# Patient Record
Sex: Female | Born: 1950 | Race: White | Hispanic: No | Smoking: Former smoker
Health system: Southern US, Community
[De-identification: ages and names within clinical notes are randomized; demographics above are authoritative.]

## PROBLEM LIST (undated history)

## (undated) DIAGNOSIS — I10 Essential (primary) hypertension: Secondary | ICD-10-CM

## (undated) DIAGNOSIS — M8580 Other specified disorders of bone density and structure, unspecified site: Secondary | ICD-10-CM

## (undated) DIAGNOSIS — K219 Gastro-esophageal reflux disease without esophagitis: Secondary | ICD-10-CM

## (undated) DIAGNOSIS — R32 Unspecified urinary incontinence: Secondary | ICD-10-CM

## (undated) DIAGNOSIS — H269 Unspecified cataract: Secondary | ICD-10-CM

## (undated) DIAGNOSIS — G4730 Sleep apnea, unspecified: Secondary | ICD-10-CM

## (undated) DIAGNOSIS — G2581 Restless legs syndrome: Secondary | ICD-10-CM

## (undated) DIAGNOSIS — G709 Myoneural disorder, unspecified: Secondary | ICD-10-CM

## (undated) DIAGNOSIS — F419 Anxiety disorder, unspecified: Secondary | ICD-10-CM

## (undated) DIAGNOSIS — M1990 Unspecified osteoarthritis, unspecified site: Secondary | ICD-10-CM

## (undated) DIAGNOSIS — Z8619 Personal history of other infectious and parasitic diseases: Secondary | ICD-10-CM

## (undated) DIAGNOSIS — Z87440 Personal history of urinary (tract) infections: Secondary | ICD-10-CM

## (undated) DIAGNOSIS — T7840XA Allergy, unspecified, initial encounter: Secondary | ICD-10-CM

## (undated) HISTORY — DX: Unspecified osteoarthritis, unspecified site: M19.90

## (undated) HISTORY — DX: Personal history of other infectious and parasitic diseases: Z86.19

## (undated) HISTORY — PX: BACK SURGERY: SHX140

## (undated) HISTORY — PX: TONSILLECTOMY: SUR1361

## (undated) HISTORY — PX: REPLACEMENT TOTAL KNEE: SUR1224

## (undated) HISTORY — DX: Anxiety disorder, unspecified: F41.9

## (undated) HISTORY — DX: Other specified disorders of bone density and structure, unspecified site: M85.80

## (undated) HISTORY — DX: Myoneural disorder, unspecified: G70.9

## (undated) HISTORY — DX: Gastro-esophageal reflux disease without esophagitis: K21.9

## (undated) HISTORY — DX: Sleep apnea, unspecified: G47.30

## (undated) HISTORY — PX: OTHER SURGICAL HISTORY: SHX169

## (undated) HISTORY — DX: Allergy, unspecified, initial encounter: T78.40XA

## (undated) HISTORY — DX: Unspecified urinary incontinence: R32

## (undated) HISTORY — PX: TUBAL LIGATION: SHX77

## (undated) HISTORY — DX: Restless legs syndrome: G25.81

## (undated) HISTORY — DX: Unspecified cataract: H26.9

## (undated) HISTORY — PX: CARDIAC CATHETERIZATION: SHX172

## (undated) HISTORY — PX: HEMORROIDECTOMY: SUR656

## (undated) HISTORY — DX: Essential (primary) hypertension: I10

## (undated) HISTORY — DX: Personal history of urinary (tract) infections: Z87.440

## (undated) HISTORY — PX: CATARACT EXTRACTION, BILATERAL: SHX1313

## (undated) HISTORY — PX: CHOLECYSTECTOMY: SHX55

## (undated) HISTORY — PX: SALIVARY GLAND SURGERY: SHX768

---

## 2007-01-12 ENCOUNTER — Encounter: Admission: RE | Admit: 2007-01-12 | Discharge: 2007-01-12 | Payer: Self-pay | Admitting: Specialist

## 2008-05-22 ENCOUNTER — Encounter: Admission: RE | Admit: 2008-05-22 | Discharge: 2008-05-22 | Payer: Self-pay | Admitting: Specialist

## 2008-07-20 ENCOUNTER — Inpatient Hospital Stay (HOSPITAL_COMMUNITY): Admission: RE | Admit: 2008-07-20 | Discharge: 2008-07-23 | Payer: Self-pay | Admitting: Specialist

## 2008-07-20 IMAGING — CR DG CHEST 2V
2 series · 2 of 2 positions shown · non-contrast
Comparison: None available.

CLINICAL DATA: Preop respiratory film in patient for lumbar
surgery.

CHEST - 2 VIEW

[w chest pa]
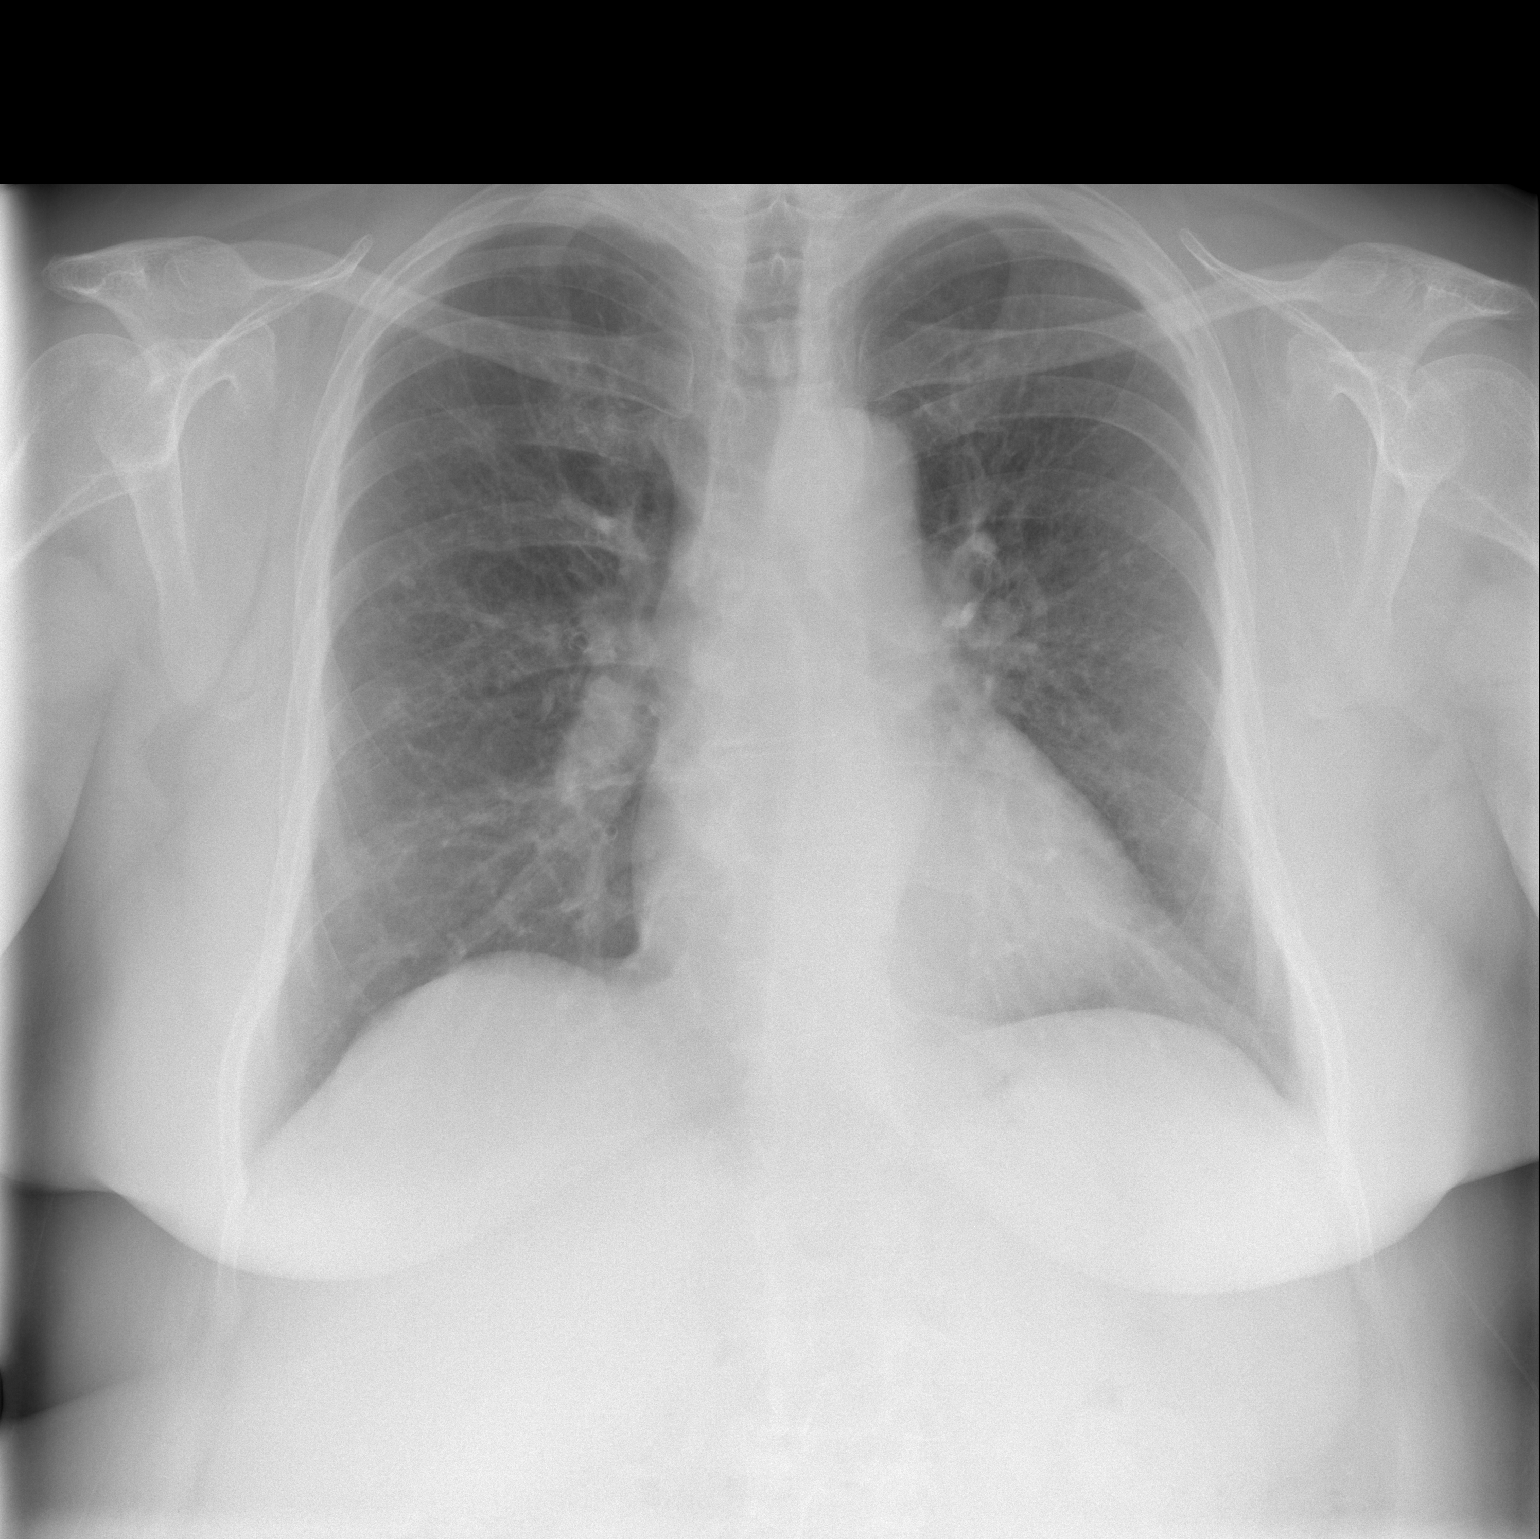

[w chest lat]
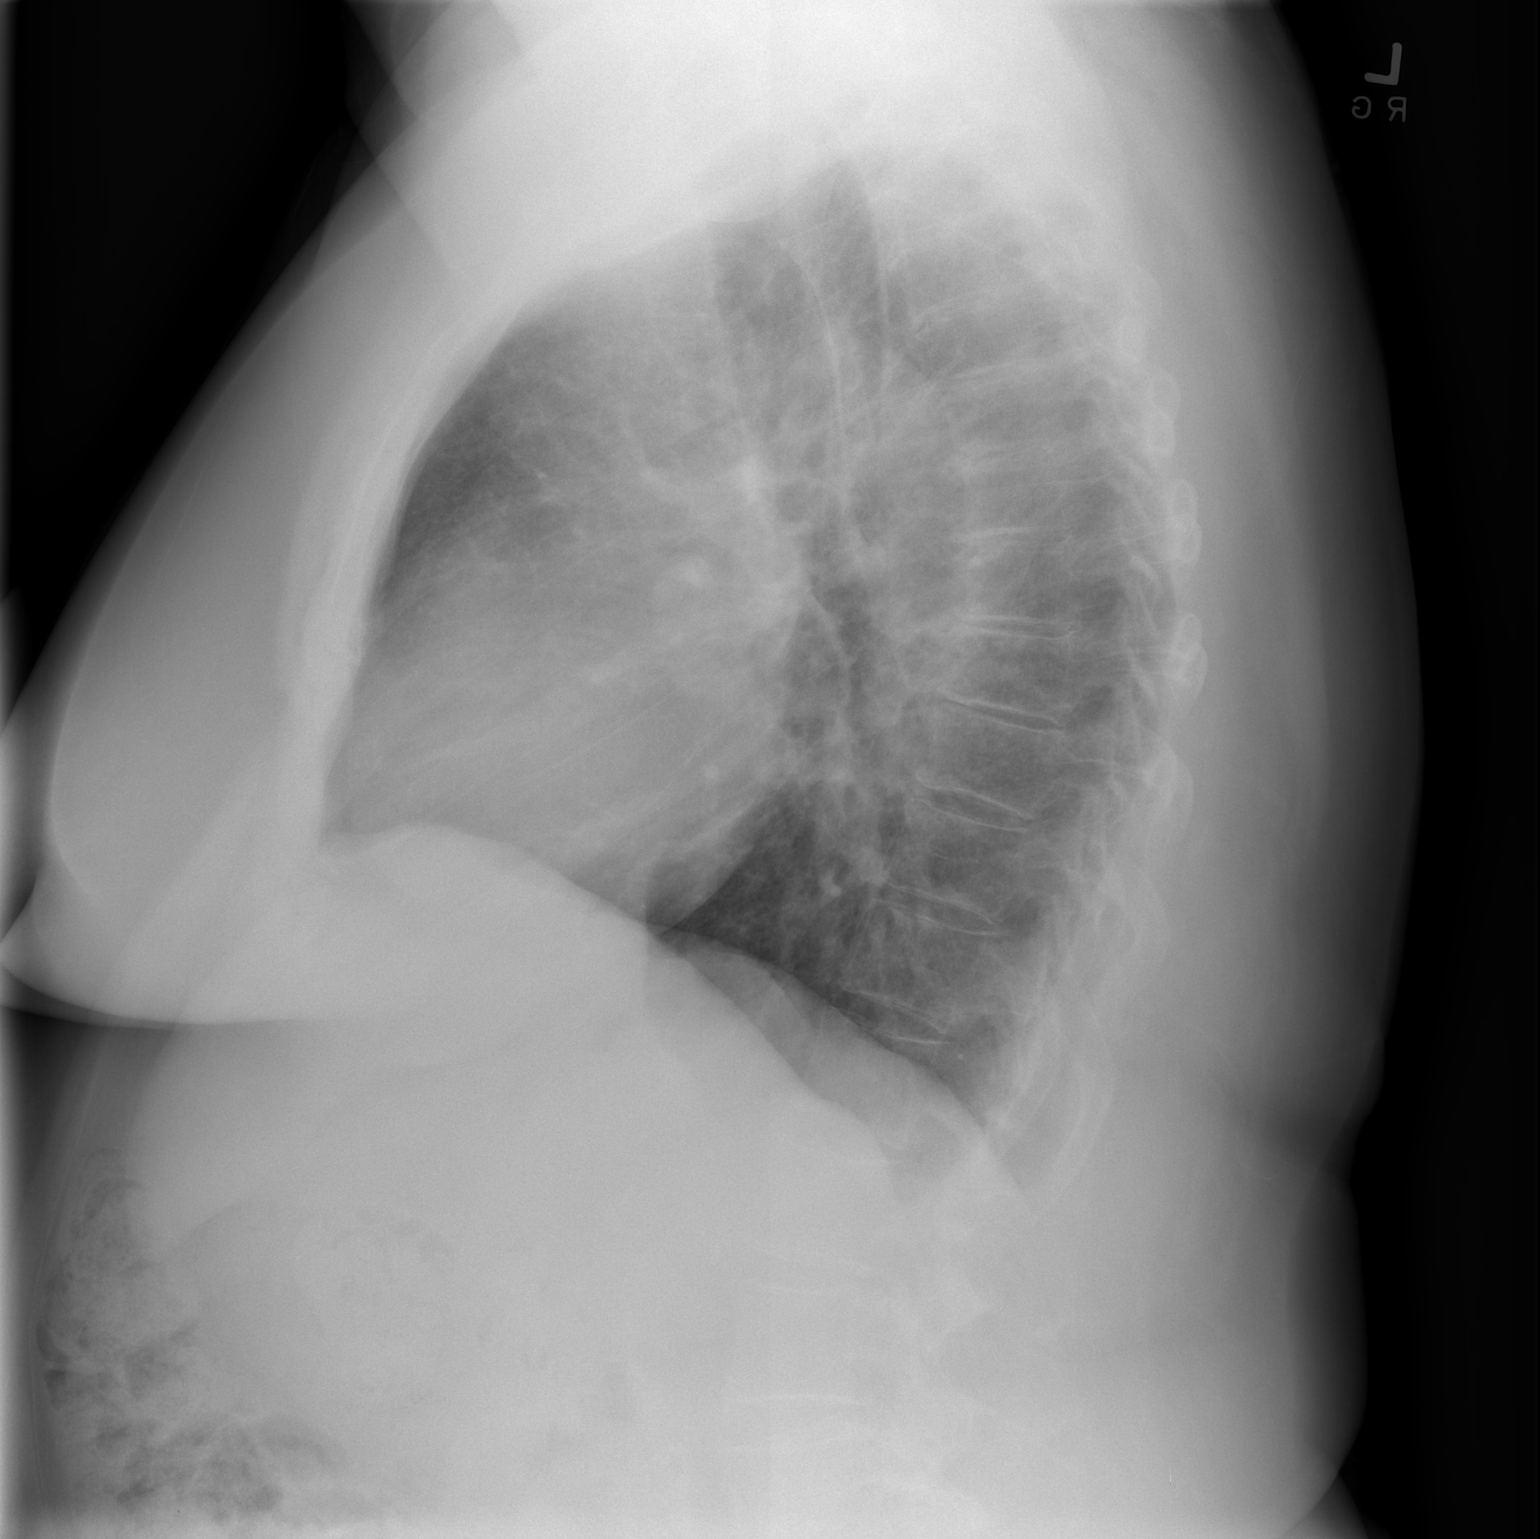

[2 of 2 positions shown; findings below may reference images not displayed]

FINDINGS: The lungs are clear.  Heart size is mildly enlarged.  No
pleural effusion or focal bony abnormality.
IMPRESSION: No acute disease.

## 2009-07-03 ENCOUNTER — Ambulatory Visit: Payer: Self-pay | Admitting: Gastroenterology

## 2009-07-03 DIAGNOSIS — I10 Essential (primary) hypertension: Secondary | ICD-10-CM

## 2009-07-03 DIAGNOSIS — K219 Gastro-esophageal reflux disease without esophagitis: Secondary | ICD-10-CM

## 2009-07-06 ENCOUNTER — Ambulatory Visit: Payer: Self-pay | Admitting: Gastroenterology

## 2009-07-06 ENCOUNTER — Encounter: Payer: Self-pay | Admitting: Gastroenterology

## 2009-07-10 ENCOUNTER — Encounter: Payer: Self-pay | Admitting: Gastroenterology

## 2009-07-25 ENCOUNTER — Ambulatory Visit: Payer: Self-pay | Admitting: Gastroenterology

## 2009-07-30 ENCOUNTER — Encounter: Payer: Self-pay | Admitting: Gastroenterology

## 2009-08-01 ENCOUNTER — Encounter: Payer: Self-pay | Admitting: Gastroenterology

## 2009-09-05 ENCOUNTER — Ambulatory Visit: Payer: Self-pay | Admitting: Gastroenterology

## 2009-09-05 DIAGNOSIS — R112 Nausea with vomiting, unspecified: Secondary | ICD-10-CM

## 2009-09-28 ENCOUNTER — Ambulatory Visit (HOSPITAL_COMMUNITY): Admission: RE | Admit: 2009-09-28 | Discharge: 2009-09-28 | Payer: Self-pay | Admitting: Gastroenterology

## 2009-09-28 ENCOUNTER — Telehealth: Payer: Self-pay | Admitting: Gastroenterology

## 2009-10-16 ENCOUNTER — Telehealth: Payer: Self-pay | Admitting: Gastroenterology

## 2009-10-22 ENCOUNTER — Ambulatory Visit: Payer: Self-pay | Admitting: Gastroenterology

## 2009-12-12 ENCOUNTER — Ambulatory Visit: Payer: Self-pay | Admitting: Gastroenterology

## 2010-10-09 NOTE — Progress Notes (Signed)
Summary: TRIAGE  Phone Note Call from Patient Call back at [phone]   Caller: Patient  [phone] Call For: Arlyce Dice Reason for Call: Talk to Nurse Summary of Call: Continues with heartburn Initial call taken by: Tawni Levy,  September 28, 2009 4:13 PM  Follow-up for Phone Call        She is taking Omperazole 40mg  at bedtime, her burning at night has improved, but still present. And, "I am burning all day long, I eat Tums like they are candy"  1) Increase Omperazole to two times a day  2) Soft,bland diet. No spicy,greasy,fried foods.  3) I will call pt., if new orders, after MD reviews.   Follow-up by: Laureen Ochs LPN,  September 28, 2009 4:21 PM  Additional Follow-up for Phone Call Additional follow up Details #1::        trial of reglan 10mg  1/2 ac and hs c/b 1 week Additional Follow-up by: Louis Meckel MD,  October 01, 2009 9:48 AM    Additional Follow-up for Phone Call Additional follow up Details #2::    Pt. states since taking Omeprazole 40mg  two times a day she has improvement in her symptoms, would like to try one week, before trying Reglan. She will continue Omeprazole two times a day and call back in one week with an update. We will hold Reglan for now. Follow-up by: Laureen Ochs LPN,  October 01, 2009 10:00 AM  New/Updated Medications: OMEPRAZOLE 40 MG  CPDR (OMEPRAZOLE) 1 each day 30 minutes before meal Prescriptions: OMEPRAZOLE 40 MG  CPDR (OMEPRAZOLE) 1 each day 30 minutes before meal  #30 x 6   Entered by:   Laureen Ochs LPN   Authorized by:   Louis Meckel MD   Signed by:   Laureen Ochs LPN on 75/64/3329   Method used:   Electronically to        [facility] [phone]* (retail)       [address] [place], [place]  [address]       Ph: [phone] or [phone]       Fax: [phone]   RxID:   [id number]

## 2010-10-09 NOTE — Assessment & Plan Note (Signed)
Summary: F/U APPT...LSW.   History of Present Illness Visit Type: Follow-up Visit Primary GI MD: Melvia Heaps MD [facility] Primary Provider: Dara Hoyer, MD Requesting Provider: N/A Chief Complaint: GERD, symptoms mostly at night History of Present Illness:   Mrs. Olivia Brady returned for followup of her GERD.  On a regimen of Zegerid q.h.s. and q.a.m. her symptoms are fairly well controlled.  She was unsuccessful in limiting medicine to a single morning dose.  She had severe pyrosis despite taking antacids throughout the day.  She has mild gastroparesis as determined by gastric emptying scan.   GI Review of Systems    Reports acid reflux.     Location of  Abdominal pain: epigastric area.    Denies abdominal pain, belching, bloating, chest pain, dysphagia with liquids, dysphagia with solids, heartburn, loss of appetite, nausea, vomiting, vomiting blood, weight loss, and  weight gain.      Reports diarrhea.     Denies anal fissure, black tarry stools, change in bowel habit, constipation, diverticulosis, fecal incontinence, heme positive stool, hemorrhoids, irritable bowel syndrome, jaundice, light color stool, liver problems, rectal bleeding, and  rectal pain.    Current Medications (verified): 1)  Avalide 150-12.5 Mg Tabs (Irbesartan-Hydrochlorothiazide) .Marland Kitchen.. 1 Tablet By Mouth Once Daily 2)  Vitamin D3 2000 Unit Caps (Cholecalciferol) .Marland Kitchen.. 1 Capsule By Mouth Once Daily 3)  Oxybutynin Chloride 10 Mg Xr24h-Tab (Oxybutynin Chloride) .... Once Daily 4)  Calcium 600 1500 Mg Tabs (Calcium Carbonate) .Marland Kitchen.. 1 Tablet  Po Once Daily 5)  Tums Smoothies 750 Mg Chew (Calcium Carbonate Antacid) .... 6 At Bedtime As Needed 6)  Pain Reliever 325 Mg Tabs (Acetaminophen) .... Take As Needed 7)  Green Tea 250 Mg Caps (Green Tea (Camillia Sinensis)) .Marland Kitchen.. 1 Capsule By Mouth Once Daily 8)  Zegerid 40-1100 Mg Caps (Omeprazole-Sodium Bicarbonate) .... Take One Tab Q.h.s. and One Tab Q.a.m.  Allergies (verified): 1)   ! Pcn 2)  ! Erythromycin 3)  ! * Avelox  Past History:  Past Medical History: Reviewed history from 07/03/2009 and no changes required. 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 lumbar decompression Tubal Ligation  Family History: Reviewed history from 07/03/2009 and no changes required. Family History of Diabetes:  Family History of Heart Disease:  Family History of Kidney Disease: Family History of Colon Cancer:2 uncles  Social History: Reviewed history from 07/03/2009 and no changes required. Widowed 1 boy 1 girl Retired Patient is a former smoker. 2 years  Alcohol Use - no Daily Caffeine Use 12 cups per day  Review of Systems       The patient complains of allergy/sinus, cough, fever, headaches-new, sore throat, and urination - excessive.  The patient denies anemia, anxiety-new, arthritis/joint pain, back pain, blood in urine, breast changes/lumps, change in vision, confusion, coughing up blood, depression-new, fainting, fatigue, hearing problems, heart murmur, heart rhythm changes, itching, menstrual pain, muscle pains/cramps, night sweats, nosebleeds, pregnancy symptoms, shortness of breath, skin rash, sleeping problems, swelling of feet/legs, swollen lymph glands, thirst - excessive , urination - excessive , urination changes/pain, urine leakage, vision changes, and voice change.    Vital Signs:  Patient profile:   60 year old female Height:      62 inches Weight:      227.25 pounds BMI:     41.71 Pulse rate:   64 / minute Pulse rhythm:   regular BP sitting:   100 / 70  (  left arm) Cuff size:   regular  Vitals Entered By: June McMurray CMA Duncan Dull) (December 12, 2009 10:05 AM)   Impression & Recommendations:  Problem # 1:  ESOPHAGEAL REFLUX (ICD-530.81) She has a good control symptoms on twice a day Zegerid.  Recommendations #1 continue  current regimen. #2 antireflux measures were discussed  Patient Instructions: 1)  GI Reflux brochure given.  2)  GERD Prevention diet brochure given. 3)  Please schedule a follow-up appointment as needed.  4)  The medication list was reviewed and reconciled.  All changed / newly prescribed medications were explained.  A complete medication list was provided to the patient / caregiver. 5)  CC Dr. Dara Hoyer

## 2010-10-09 NOTE — Assessment & Plan Note (Signed)
Summary: f.u after gastric emptying scan...em   History of Present Illness Visit Type: Follow-up Visit Primary GI MD: Melvia Heaps MD [facility] Primary Provider: N/A Requesting Provider: N/A Chief Complaint: F/U GES, Constant  RUQ/LUQ pain History of Present Illness:   Ms. Olivia Brady has returned for followup of her reflux. At I twice a day omeprazole symptoms are well-controlled.  She has occasional breakthrough pyrosis.  She has a mild gastroparesis as well.  Symptoms are best control at night unless she takes her medicine too close to bedtime.   GI Review of Systems    Reports abdominal pain, acid reflux, heartburn, nausea, and  vomiting.     Location of  Abdominal pain: RUQ.    Denies belching, bloating, chest pain, dysphagia with liquids, dysphagia with solids, loss of appetite, vomiting blood, weight loss, and  weight gain.      Reports change in bowel habits.     Denies anal fissure, black tarry stools, constipation, diarrhea, diverticulosis, fecal incontinence, heme positive stool, hemorrhoids, irritable bowel syndrome, jaundice, light color stool, liver problems, rectal bleeding, and  rectal pain.    Current Medications (verified): 1)  Avalide 150-12.5 Mg Tabs (Irbesartan-Hydrochlorothiazide) .Marland Kitchen.. 1 Tablet By Mouth Once Daily 2)  Vitamin D3 2000 Unit Caps (Cholecalciferol) .Marland Kitchen.. 1 Capsule By Mouth Once Daily 3)  Detrol La 4 Mg Xr24h-Cap (Tolterodine Tartrate) .Marland Kitchen.. 1 Tablet By Mouth Once Daily 4)  Calcium 600 1500 Mg Tabs (Calcium Carbonate) .Marland Kitchen.. 1 Tablet  Po Once Daily 5)  Tums Smoothies 750 Mg Chew (Calcium Carbonate Antacid) .... 6 At Bedtime As Needed 6)  Pain Reliever 325 Mg Tabs (Acetaminophen) .... Take As Needed 7)  Green Tea 250 Mg Caps (Green Tea (Camillia Sinensis)) .Marland Kitchen.. 1 Capsule By Mouth Once Daily 8)  Omeprazole 40 Mg  Cpdr (Omeprazole) .Marland Kitchen.. 1 Each Day 30 Minutes Before Meal  Allergies (verified): 1)  ! Pcn 2)  ! Erythromycin 3)  ! * Avelox  Past History:  Past  Medical History: Reviewed history from 07/03/2009 and no changes required. 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 lumbar decompression Tubal Ligation  Family History: Reviewed history from 07/03/2009 and no changes required. Family History of Diabetes:  Family History of Heart Disease:  Family History of Kidney Disease: Family History of Colon Cancer:2 uncles  Social History: Reviewed history from 07/03/2009 and no changes required. Widowed 1 boy 1 girl Retired Patient is a former smoker. 2 years  Alcohol Use - no Daily Caffeine Use 12 cups per day  Review of Systems       The patient complains of arthritis/joint pain, back pain, cough, fatigue, headaches-new, itching, skin rash, swelling of feet/legs, and urination - excessive.    Vital Signs:  Patient profile:   60 year old female Height:      62 inches Weight:      228.50 pounds BMI:     41.94 Pulse rate:   76 / minute Pulse rhythm:   regular BP sitting:   122 / 80  (left arm) Cuff size:   regular  Vitals Entered By: June McMurray CMA Duncan Dull) (October 22, 2009 2:33 PM)   Impression & Recommendations:  Problem # 1:  ESOPHAGEAL REFLUX (ICD-530.81) Plan Zegerid q.a.m. and q.h.s. for 4 weeks.  She will try to lower it to  once daily at that point.  Patient Instructions: 1)  GI Reflux brochure given.  2)  Please  schedule a follow-up appointment in 4 to 6 weeks.  Prescriptions: ZEGERID 40-1100 MG CAPS (OMEPRAZOLE-SODIUM BICARBONATE) take one tab q.h.s. and one tab q.a.m.  #60 x 3   Entered by:   Merri Ray CMA (AAMA)   Authorized by:   Louis Meckel MD   Signed by:   Merri Ray CMA (AAMA) on 10/22/2009   Method used:   Electronically to        [facility] [phone]* (retail)       [address] [place], [place]  [address]       Ph:  [phone] or [phone]       Fax: [phone]   RxID:   [id number]   Appended Document: f.u after gastric emptying scan...em    Clinical Lists Changes  Medications: Rx of ZEGERID 40-1100 MG CAPS (OMEPRAZOLE-SODIUM BICARBONATE) take one tab q.h.s. and one tab q.a.m.;  #180 x 4;  Signed;  Entered by: Merri Ray CMA (AAMA);  Authorized by: Louis Meckel MD;  Method used: Print then Give to Patient    Prescriptions: ZEGERID 40-1100 MG CAPS (OMEPRAZOLE-SODIUM BICARBONATE) take one tab q.h.s. and one tab q.a.m.  #180 x 4   Entered by:   Merri Ray CMA (AAMA)   Authorized by:   Louis Meckel MD   Signed by:   Merri Ray CMA (AAMA) on 10/22/2009   Method used:   Print then Give to Patient   RxID:   [id number]    Appended Document: f.u after gastric emptying scan...em Sent 1 month supply to [facility], gave pt printed rx to sent a 90 day supply to [facility].

## 2010-10-09 NOTE — Progress Notes (Signed)
Summary: Omeprazole rx  ---- Converted from flag ---- ---- 10/16/2009 3:43 PM, Laureen Ochs LPN wrote: I just spoke with her, she forgot she was to callback with an update. She is doing well on Omeprazole 40mg  two times a day, she never started the Reglan. She doesn't want to change anything until she talks to him at her appt. on the 14th. She has enough Omeprazole to get her till then, so just check with him at her appt. to see if we need to change from once daily  to two times a day.- Thanks! Deb --- 10/16/2009 3:11 PM, Merri Ray CMA (AAMA) wrote: Her last rx you sent of omeprazole was sent in for 1 by mouth once daily but in the note you have for her to increase two times a day . The pharmacy is questioning how her rx is suppose to be written, just let me know, I will take care of it   thanks ------------------------------  Phone Note Outgoing Call Call back at [facility] in [place] [phone]   Call placed by: Merri Ray CMA Duncan Dull),  October 16, 2009 3:59 PM Summary of Call: Pharmacy informed pt will wait until her appointment to see Dr Arlyce Dice on the 14th to discuss the omeprazole. We will refill at that time. Initial call taken by: Merri Ray CMA [facility]),  October 16, 2009 4:01 PM

## 2011-01-21 NOTE — Op Note (Signed)
NAMEAIRIS, Olivia Brady                 ACCOUNT NO.:  [id number]   MEDICAL RECORD NO.:  [id number]          PATIENT TYPE:  AMB   LOCATION:  DAY                          FACILITY:  [facility]   PHYSICIAN:  Jene Every, M.D.    DATE OF BIRTH:  Jun 10, 1951   DATE OF PROCEDURE:  07/20/2008  DATE OF DISCHARGE:                               OPERATIVE REPORT   PREOPERATIVE DIAGNOSIS:  Spinal stenosis, spondylolisthesis, L4-5.   POSTOPERATIVE DIAGNOSIS:  Spinal stenosis, spondylolisthesis, L4-5.   PROCEDURES PERFORMED:  1. Lumbar decompression, L4-5.  2. Posterior fusion, L5-S1, utilizing bilateral transfacet pedicle      screws at the L4-5.  3. Lateral mass fusion on the left utilizing autologous and allograft      bone graft.  4. Fusion of the facet at L4-5 utilizing autologous and allograft bone      graft.   ANESTHESIA:  General.   ASSISTANT:  Alvy Beal, MD   BRIEF HISTORY:  The patient is a 60 year old with lower extremity  radicular pain predominantly on the right.  She had lateral recess  stenosis and neuroforaminal stenosis.  She had a grade 1 listhesis at L4-  5 and degenerative scoliosis, and had some disk degeneration at L5-S1.  She had some instability of flexion and extension.  She was indicated  for lateral recess decompression particularly on the right, facet  screw/pedicle screw fixation and lateral mass fusion utilizing  autologous and allograft bone graft.  The risks and benefits were  discussed to include:  Infection, injury to neurovascular structures,  CSF leakage, epidural fibrosis, adjacent segment disease, need for  fusion in the future, anesthetic complications, etc.   TECHNIQUE:  The patient was placed supine position after the induction  of adequate general anesthesia, Foley, 2 grams Kefzol.  She was placed  prone on the Wilson frame in slight flexion on the [place] table.  The  lumbar region was prepped and draped in the usual sterile fashion.  Under C-arm,  we confirmed the L4-5 space utilizing spinal needles.  The  incision was made from the spinous process of L4 to S1.  The  subcutaneous tissue was dissected.  Electrocautery was utilized to  achieve hemostasis.  The dorsolumbar fascia was identified and divided  in line with the skin incision.  The paraspinous muscle was elevated  from the lamina of L4 and L5.  The lamina of L4 and L5 were identified.  The McCullough retractor was placed.  We identified the interlaminar  space by x-ray.   We decapsulated both facets at L4-5 utilizing electrocautery, Bovie, and  a curette.  We curetted the facets, decorticating the facets, sparing  the pars.  We removed the interspinous ligament and skeletonized the  spinous processes of L4 and L5.  We used a lamina spreader to expose the  facets with further curetting and decorticating.  We then proceeded with  a decompression or L4-5 on the right.  A small hemilaminotomy on the  caudad was performed with a 2 mm Kerrison, the cephalad edge with a 5,  detached the ligamentum flavum with  a straight curette.  I performed a  foraminotomy of L5 and of L4 roots in the lateral recess.  Secondary  ligamentum flavum and facet hypertrophy.  The neural elements were well  protected.  We decompressed it to the medial border of pedicle.  We then  performed foraminotomy of L4 and L5.  With now widely patent L4 and L5  foramen, this was without compromising the lamina of L4 for facet screw  fixation.  Following this, we put the spinal frame in to and out of  flexion to close down then facets.  After removing the spinous process  of L4, morselizing autologous bone, this was saved for future bone  grafting.   We turned our attention towards the facet screws that were placed.  Used  a guide pin under C-arm, advanced a guide pin from the outer aspect of  the facet of L4 through the facet joint space and into the pedicle of  L5.  This was confirmed by x-ray.  A hockey-stick  probe was placed in  the foramen of L4 and L5, and found them to be widely patent without  violation of the foramen.  This was overdrilled to a 30 and tapped  and  a 30 facet screw was to be used.  We packed bone graft in the facet  joint and advanced the screw with excellent purchase compressing the  facet, expressing the hematoma.  The hockey-stick probe was placed in  the foramen of L4 and L5, found to be widely patent following this as  well.   In a similar fashion, we inserted the facet screw of the left side.  Posterolateral fusion was performed here with bone graft in the facet  joint advancing the screw.  A 30-mm screw with displaced here and tapped  with excellent purchase.  This was guided in the AP and lateral plane.  This was in co-linear fashion to the screw placed on the right.  This  was satisfactory in the AP and lateral plane.  I then used Actifuse.  We  then went out into the lateral recess to expose the TP, curetted the TP,  lateral aspect of the pars and lateral aspect of the facet, placed  Actifuse bone graft into the lateral recesses bilaterally for  posterolateral fusion.   The wound was copiously irrigated, inspected, and revealed no CSF leak  or active bleeding.  We reapproximated the dorsolumbar fascia with #1  Vicryl interrupted figure-of-eight sutures, subcutaneous tissue  reapproximated with 2-0, and skin was reapproximated staples.  The wound  was dressed sterilely.  She was placed supine on the hospital bed,  extubated without difficulty, and transported to the recovery room in  satisfactory condition.   The patient tolerated the procedure well and had no complications.   ESTIMATED BLOOD LOSS:  150 mL of blood loss.      Jene Every, M.D.  Electronically Signed     JB/MEDQ  D:  07/20/2008  T:  07/20/2008  Job:  [id number]

## 2011-01-24 NOTE — H&P (Signed)
NAMELASONDRA, Olivia Brady                 ACCOUNT NO.:  [id number]   MEDICAL RECORD NO.:  [id number]          PATIENT TYPE:  INP   LOCATION:  1601                         FACILITY:  [facility]   PHYSICIAN:  Jene Every, M.D.    DATE OF BIRTH:  05-04-1951   DATE OF ADMISSION:  07/20/2008  DATE OF DISCHARGE:  07/23/2008                              HISTORY & PHYSICAL   CHIEF COMPLAINT:  Back and lower extremity pain.   HISTORY:  Olivia Brady is a pleasant 60 year old female who has noted  disabling low back pain for quite some time.  She has been in persistent  pain management but has noted continuation of her symptoms.  They are  very disabling.  Patient does have severe stenosis as well as facet  arthropathy.  It was felt that she would benefit from decompression and  lateral mass fusion using facet fixation.  The risks and benefits of  this were discussed.  Medical clearance obtained.  The patient does  elect to proceed.   MEDICAL HISTORY:  1. Hypertension.  2. Pain syndrome.  3. Gastroesophageal reflux disease.  4. Anxiety.   CURRENT MEDICATIONS:  1. Morphine 30 mg 1 p.o. b.i.d.  2. Morphine sulfate 15 mg 1 p.o. q.4h. p.r.n. pain.  3. Avalide 150 mg 1 p.o. q.a.m.  4. Nexium 40 mg 1 p.o. q.a.m.  5. Ditropan XL 10 mg 1 p.o. q.a.m.  6. Vitamin D3 2000 units q.a.m.  7. Calcium.  8. Fiber caplets.  9. Zantac 150 mg p.r.n.  10.Prilosec 20 mg 1 p.o. p.r.n.  11.Coricidin D 2 tablets, an over-the-counter decongestant.   ALLERGIES:  1. PENICILLIN, causes hives.  2. ERYTHROMYCIN, hives and swelling.  3. AVELOX , bleeding in the bowels.   REVIEW OF SYSTEMS:  GENERAL:  Patient denies any fever, chills, night  sweats, or bleeding tendencies.  CNS:  No blurred or double vision,  seizure, headache, or paralysis.  RESPIRATORY:  No shortness of breath,  productive cough, or hemoptysis.  CARDIAC:  No chest pain, angina, or  orthopnea.  GU:  No dysuria, hematuria, or discharge.  GI:  No nausea,  vomiting, diarrhea, constipation, or melena.  MUSCULOSKELETAL:  As per  HPI.   PHYSICAL EXAMINATION:  This overweight female who ambulates with an  antalgic gait.  She has had pain with forward flexion and extension of  the lumbar spine.  Straight leg raise does produce some low back pain  bilaterally.  CHEST:  Clear to auscultation bilaterally.  No wheezes, rales or  rhonchi.  HEART:  Regular rate and rhythm without murmurs, rubs or gallops.  ABDOMEN:  Soft and nontender.  Nondistended.  Bowel sounds x4.   IMPRESSION:  1. Severe spinal stenosis.  2. Facet arthropathy at 4-5.   PLAN:  Patient will be admitted to undergo lumbar decompression at 4-5,  lateral mass fusion with facet fixation.      Olivia Brady, P.A.      Jene Every, M.D.  Electronically Signed    CS/MEDQ  D:  08/30/2008  T:  08/30/2008  Job:  [id number]

## 2011-01-24 NOTE — Discharge Summary (Signed)
Olivia Brady, Olivia Brady                 ACCOUNT NO.:  [id number]   MEDICAL RECORD NO.:  [id number]          PATIENT TYPE:  INP   LOCATION:  1601                         FACILITY:  [facility]   PHYSICIAN:  Jene Every, M.D.    DATE OF BIRTH:  Nov 18, 1950   DATE OF ADMISSION:  07/20/2008  DATE OF DISCHARGE:  07/23/2008                               DISCHARGE SUMMARY   ADMISSION DIAGNOSES:  1. Spinal stenosis L4-5.  2. Hypertension.  3. Chronic pain syndrome.  4. Anxiety.   DISCHARGE DIAGNOSES:  1. Spinal stenosis L4-5,j status post decompression L4-5 with      __________ fixation history.  2. Hypertension.  3. Chronic pain syndrome.  4. Anxiety.   HISTORY:  Ms. Hentges is well-known to our practice.  She is a 60 year old  female who has noted a longstanding history of disabling back pain.  It  has progressively gotten worse.  It is felt she would benefit from  lumbar decompression, as well as facet fixation.  The risks and benefits  of this were discussed with the patient, medical clearance was obtained  and the patient did elect to proceed.   CONSULTANTS:  1. PT/OT.  2. Care Management.   PROCEDURES:  The patient was taken to the OR on July 20, 2008 and  underwent lumbar decompression at 4/5 with __________ fusion and facet  fixation.  Surgeon - Dr. Jene Every.  Assistant - Venita Lick, M.D.  Anesthesia - general.  Complications - none.   LABORATORY:  Preoperative CBC shows a white blood cell count of 6.8,  hemoglobin 12.8, hematocrit 37.8.  This was monitored throughout the  hospital course.  White cell count did bump to 10.9.  Hemoglobin  decreased at 10.8.  Hematocrit 32.0.  The patient remained stable and  coagulation studies done preoperatively within normal range.  Preoperative chemistry showed normal sodium and potassium, slightly  elevated glucose of 120, normal BUN and creatinine.  These values  remained stable throughout the hospital course.  Glucose did  fluctuate  slightly.  Renal function remained stable.  Preoperative liver function  tests were slightly decreased in her albumin at 3.4; otherwise normal.  Preoperative urinalysis was negative.  Blood type is O+.  Preoperative  EKG showed T-wave abnormality. Preoperative chest x-ray showed no active  disease.   HOSPITAL COURSE:  The patient was admitted, taken to the OR and  underwent the above-stated procedure without difficulty.  She was then  transferred to the PACU and then to the orthopedic for continued  postoperative care.  The patient was placed on PCA for pain relief  initially.  Initially, she did fairly well.  Pain was controlled.  She  was slightly anxious to get up and out of bed.  She denied any headache,  chest pain or shortness of breath.  She was slightly febrile at 99.3.  Incentive spirometer was encouraged.  Incision was clean, dry.  PT and  OT was ordered.  The patient was weaned from her PCA.  Foley was  discontinued.  The patient continued to improve, was progressing well  with therapy.  Discharge planning was initiated.  On postoperative day  #3, it was felt the patient was stable to be discharged home.  Pain was  well-controlled on p.o. analgesics.  Labs were stable.  The incision was  clean and dry and showed no evidence of infection.   DISPOSITION:  The patient is stable to be discharged home.   FOLLOWUP:  She is to follow up with Dr. Shelle Iron in approximately 10-14  days for suture removal and x-ray.   WOUND CARE:  She is to keep her dressing clean, dry and change this  daily.   DISCHARGE MEDICATIONS:  Include all home medications, vitamin C 500 mg,  Robaxin 500 mg one p.o. q.8h. p.r.n. spasm.  She is to resume her  morphine.   ACTIVITY:  She is to walk as tolerated, use the brace when out of bed,  utilize back precautions.  No lifting for 6 weeks.  No driving for 2-4  weeks.   DIET:  As tolerated.   CONDITION ON DISCHARGE:  Stable.   FINAL DIAGNOSIS:   Status post decompression, facet fixation.      Roma Schanz, P.A.      Jene Every, M.D.  Electronically Signed    CS/MEDQ  D:  08/30/2008  T:  08/30/2008  Job:  [id number]

## 2011-03-03 DIAGNOSIS — G4733 Obstructive sleep apnea (adult) (pediatric): Secondary | ICD-10-CM | POA: Insufficient documentation

## 2011-06-10 LAB — URINALYSIS, ROUTINE W REFLEX MICROSCOPIC
Glucose, UA: NEGATIVE
Nitrite: NEGATIVE
Protein, ur: NEGATIVE
Urobilinogen, UA: 0.2

## 2011-06-10 LAB — DIFFERENTIAL
Basophils Absolute: 0
Lymphocytes Relative: 38
Monocytes Absolute: 0.6
Neutro Abs: 3.5
Neutrophils Relative %: 51

## 2011-06-10 LAB — BASIC METABOLIC PANEL
BUN: 4 — ABNORMAL LOW
CO2: 33 — ABNORMAL HIGH
Calcium: 8.7
Chloride: 100
Creatinine, Ser: 0.67
GFR calc Af Amer: >60
GFR calc Af Amer: >60
GFR calc non Af Amer: >60
Glucose, Bld: 125 — ABNORMAL HIGH
Glucose, Bld: 149 — ABNORMAL HIGH
Potassium: 3.8
Sodium: 139

## 2011-06-10 LAB — COMPREHENSIVE METABOLIC PANEL
ALT: 19
AST: 21
Calcium: 9
Creatinine, Ser: 0.66
GFR calc Af Amer: >60
Sodium: 137
Total Protein: 6.3

## 2011-06-10 LAB — CBC
HCT: 34.2 — ABNORMAL LOW
MCHC: 33.7
MCHC: 33.8
MCV: 90.3
MCV: 90.4
Platelets: 194
Platelets: 212
RDW: 13.3
RDW: 13.6
RDW: 13.8
WBC: 10.9 — ABNORMAL HIGH

## 2011-06-10 LAB — TYPE AND SCREEN: Antibody Screen: NEGATIVE

## 2011-06-10 LAB — ABO/RH: ABO/RH(D): O POS

## 2011-06-10 LAB — APTT: aPTT: 28

## 2011-08-26 DIAGNOSIS — R0989 Other specified symptoms and signs involving the circulatory and respiratory systems: Secondary | ICD-10-CM | POA: Insufficient documentation

## 2013-05-24 DIAGNOSIS — L821 Other seborrheic keratosis: Secondary | ICD-10-CM | POA: Insufficient documentation

## 2014-12-02 DIAGNOSIS — M48061 Spinal stenosis, lumbar region without neurogenic claudication: Secondary | ICD-10-CM | POA: Insufficient documentation

## 2015-05-17 DIAGNOSIS — N816 Rectocele: Secondary | ICD-10-CM | POA: Insufficient documentation

## 2015-05-17 DIAGNOSIS — R32 Unspecified urinary incontinence: Secondary | ICD-10-CM | POA: Insufficient documentation

## 2016-01-07 ENCOUNTER — Encounter: Payer: Self-pay | Admitting: Gastroenterology

## 2017-06-24 DIAGNOSIS — M1711 Unilateral primary osteoarthritis, right knee: Secondary | ICD-10-CM | POA: Insufficient documentation

## 2018-01-04 DIAGNOSIS — H18453 Nodular corneal degeneration, bilateral: Secondary | ICD-10-CM | POA: Insufficient documentation

## 2018-01-28 DIAGNOSIS — Z9889 Other specified postprocedural states: Secondary | ICD-10-CM | POA: Insufficient documentation

## 2018-06-28 DIAGNOSIS — R9439 Abnormal result of other cardiovascular function study: Secondary | ICD-10-CM | POA: Insufficient documentation

## 2019-07-07 ENCOUNTER — Encounter: Payer: Self-pay | Admitting: Gastroenterology

## 2019-09-21 ENCOUNTER — Encounter: Payer: Self-pay | Admitting: Gastroenterology

## 2019-09-22 ENCOUNTER — Encounter: Payer: Self-pay | Admitting: Gastroenterology

## 2019-10-03 ENCOUNTER — Other Ambulatory Visit: Payer: Self-pay

## 2019-10-03 ENCOUNTER — Telehealth: Payer: Self-pay | Admitting: *Deleted

## 2019-10-03 ENCOUNTER — Ambulatory Visit (AMBULATORY_SURGERY_CENTER): Payer: Self-pay | Admitting: *Deleted

## 2019-10-03 VITALS — Temp 97.5°F | Ht 61.0 in | Wt 192.0 lb

## 2019-10-03 DIAGNOSIS — Z1211 Encounter for screening for malignant neoplasm of colon: Secondary | ICD-10-CM

## 2019-10-03 DIAGNOSIS — Z01818 Encounter for other preprocedural examination: Secondary | ICD-10-CM

## 2019-10-03 MED ORDER — SUPREP BOWEL PREP KIT 17.5-3.13-1.6 GM/177ML PO SOLN: 1.0000 | Freq: Once | ORAL | 0 refills | Status: AC

## 2019-10-03 MED ORDER — PLENVU 140 G PO SOLR: 1.0000 | ORAL | 0 refills | Status: DC

## 2019-10-03 NOTE — Progress Notes (Signed)
No egg or soy allergy known to patient   issues with past sedation with any surgeries  or procedures of hard to wake post op , no intubation problems  No diet pills per patient No home 02 use per patient  No blood thinners per patient  Pt denies issues with constipation  No A fib or A flutter  EMMI video sent to pt's e mail  Pt has had worms the last yr- last tx 09-2019- TE to Dr Silverio Decamp   Due to the COVID-19 pandemic we are asking patients to follow these guidelines. Please only bring one care partner. Please be aware that your care partner may wait in the car in the parking lot or if they feel like they will be too hot to wait in the car, they may wait in the lobby on the 4th floor. All care partners are required to wear a mask the entire time (we do not have any that we can provide them), they need to practice social distancing, and we will do a Covid check for all patient's and care partners when you arrive. Also we will check their temperature and your temperature. If the care partner waits in their car they need to stay in the parking lot the entire time and we will call them on their cell phone when the patient is ready for discharge so they can bring the car to the front of the building. Also all patient's will need to wear a mask into building.

## 2019-10-03 NOTE — Telephone Encounter (Signed)
Ok to proceed with colonoscopy. Thanks

## 2019-10-03 NOTE — Telephone Encounter (Signed)
Plenvu is $69 - told her the coupon will not work with [facility]  Pt asked if can send any thing cheaper-  Sent in [place] will check price and let me know if she needs new instructions for [employer] tomorrow

## 2019-10-03 NOTE — Telephone Encounter (Signed)
Dr Silverio Decamp,  I saw this patient in Pv today- she told me she has been treated for worms in the last year several times- last treatment stopped in January 14-2021-- she was able to see the worms in her stool - over 500 plus she told me - she has had abdominal pain- she told me she has not seen any in the last 4 days after  she stopped  Praziquantel and Enzyme aide Digestive- - she had her last colon 2010 with Deatra Ina- HPP X 2  Should she have an OV?  Is she ok to have a colon 10-13-2019?  Please advise-   Thanks,Marie

## 2019-10-04 NOTE — Telephone Encounter (Signed)
[place] - marie pV

## 2019-10-04 NOTE — Telephone Encounter (Signed)
Pt called and states SUprep is about the same price as Plenvu and since she has the Plenvu instructions she wishes to just purchase that and not have to worry about new instructions and another prep- I offered her a sample of Plenvu, and she said she appreciated that but since she lives an hour away , she did not want to drive back to pick up a sample

## 2019-10-04 NOTE — Telephone Encounter (Signed)
Patient is calling you back in reference to the prep you spoke with her about.

## 2019-10-10 ENCOUNTER — Other Ambulatory Visit (HOSPITAL_COMMUNITY)
Admission: RE | Admit: 2019-10-10 | Discharge: 2019-10-10 | Disposition: A | Discharge status: Home / Self Care | Payer: Medicare Other | Source: Ambulatory Visit | Attending: Gastroenterology | Admitting: Gastroenterology

## 2019-10-10 ENCOUNTER — Other Ambulatory Visit: Payer: Self-pay

## 2019-10-10 DIAGNOSIS — Z01812 Encounter for preprocedural laboratory examination: Secondary | ICD-10-CM | POA: Diagnosis present

## 2019-10-10 DIAGNOSIS — Z20822 Contact with and (suspected) exposure to covid-19: Secondary | ICD-10-CM | POA: Insufficient documentation

## 2019-10-10 LAB — SARS CORONAVIRUS 2 (TAT 6-24 HRS): SARS Coronavirus 2: NEGATIVE

## 2019-10-13 ENCOUNTER — Encounter: Payer: Self-pay | Admitting: Gastroenterology

## 2019-10-13 ENCOUNTER — Ambulatory Visit (AMBULATORY_SURGERY_CENTER): Payer: Medicare Other | Admitting: Gastroenterology

## 2019-10-13 ENCOUNTER — Other Ambulatory Visit: Payer: Self-pay

## 2019-10-13 VITALS — BP 117/58 | HR 55 | Temp 97.5°F | Resp 13 | Ht 61.0 in | Wt 192.0 lb

## 2019-10-13 DIAGNOSIS — D128 Benign neoplasm of rectum: Secondary | ICD-10-CM | POA: Diagnosis not present

## 2019-10-13 DIAGNOSIS — Z1211 Encounter for screening for malignant neoplasm of colon: Secondary | ICD-10-CM | POA: Diagnosis not present

## 2019-10-13 DIAGNOSIS — D125 Benign neoplasm of sigmoid colon: Secondary | ICD-10-CM

## 2019-10-13 MED ORDER — SODIUM CHLORIDE 0.9 % IV SOLN: 500.0000 mL | Freq: Once | INTRAVENOUS | Status: DC

## 2019-10-13 NOTE — Patient Instructions (Signed)
Please read handouts provided. Continue present medications. Await pathology results.        YOU HAD AN ENDOSCOPIC PROCEDURE TODAY AT THE [facility]:   Refer to the procedure report that was given to you for any specific questions about what was found during the examination.  If the procedure report does not answer your questions, please call your gastroenterologist to clarify.  If you requested that your care partner not be given the details of your procedure findings, then the procedure report has been included in a sealed envelope for you to review at your convenience later.  YOU SHOULD EXPECT: Some feelings of bloating in the abdomen. Passage of more gas than usual.  Walking can help get rid of the air that was put into your GI tract during the procedure and reduce the bloating. If you had a lower endoscopy (such as a colonoscopy or flexible sigmoidoscopy) you may notice spotting of blood in your stool or on the toilet paper. If you underwent a bowel prep for your procedure, you may not have a normal bowel movement for a few days.  Please Note:  You might notice some irritation and congestion in your nose or some drainage.  This is from the oxygen used during your procedure.  There is no need for concern and it should clear up in a day or so.  SYMPTOMS TO REPORT IMMEDIATELY:   Following lower endoscopy (colonoscopy or flexible sigmoidoscopy):  Excessive amounts of blood in the stool  Significant tenderness or worsening of abdominal pains  Swelling of the abdomen that is new, acute  Fever of 100F or higher    For urgent or emergent issues, a gastroenterologist can be reached at any hour by calling [phone].   DIET:  We do recommend a small meal at first, but then you may proceed to your regular diet.  Drink plenty of fluids but you should avoid alcoholic beverages for 24 hours.  ACTIVITY:  You should plan to take it easy for the rest of today and you should NOT  DRIVE or use heavy machinery until tomorrow (because of the sedation medicines used during the test).    FOLLOW UP: Our staff will call the number listed on your records 48-72 hours following your procedure to check on you and address any questions or concerns that you may have regarding the information given to you following your procedure. If we do not reach you, we will leave a message.  We will attempt to reach you two times.  During this call, we will ask if you have developed any symptoms of COVID 19. If you develop any symptoms (ie: fever, flu-like symptoms, shortness of breath, cough etc.) before then, please call [phone].  If you test positive for Covid 19 in the 2 weeks post procedure, please call and report this information to us.    If any biopsies were taken you will be contacted by phone or by letter within the next 1-3 weeks.  Please call us at [phone] if you have not heard about the biopsies in 3 weeks.    SIGNATURES/CONFIDENTIALITY: You and/or your care partner have signed paperwork which will be entered into your electronic medical record.  These signatures attest to the fact that that the information above on your After Visit Summary has been reviewed and is understood.  Full responsibility of the confidentiality of this discharge information lies with you and/or your care-partner. 

## 2019-10-13 NOTE — Progress Notes (Signed)
To PACU, VSS. Report to [email or web address] 

## 2019-10-13 NOTE — Progress Notes (Signed)
Pt's states no medical or surgical changes since previsit or office visit.  Temp taken by LC VS taken by CW  

## 2019-10-13 NOTE — Op Note (Signed)
[place] Patient Name: Olivia Brady Procedure Date: 10/13/2019 10:47 AM MRN: [id number] Endoscopist: Mauri Pole , MD Age: 69 Referring MD:  Date of Birth: [birth year] Gender: Female Account #: [id number] Procedure:                Colonoscopy Indications:              Screening for colorectal malignant neoplasm, Last                            colonoscopy: 2010 Medicines:                Monitored Anesthesia Care Procedure:                Pre-Anesthesia Assessment:                           - Prior to the procedure, a History and Physical                            was performed, and patient medications and                            allergies were reviewed. The patient's tolerance of                            previous anesthesia was also reviewed. The risks                            and benefits of the procedure and the sedation                            options and risks were discussed with the patient.                            All questions were answered, and informed consent                            was obtained. Prior Anticoagulants: The patient has                            taken no previous anticoagulant or antiplatelet                            agents. ASA Grade Assessment: II - A patient with                            mild systemic disease. After reviewing the risks                            and benefits, the patient was deemed in                            satisfactory condition to undergo the procedure.  After obtaining informed consent, the colonoscope                            was passed under direct vision. Throughout the                            procedure, the patient's blood pressure, pulse, and                            oxygen saturations were monitored continuously. The                            Colonoscope was introduced through the anus and                            advanced to the the cecum, identified by                             appendiceal orifice and ileocecal valve. The                            colonoscopy was performed without difficulty. The                            patient tolerated the procedure well. The quality                            of the bowel preparation was excellent. The                            ileocecal valve, appendiceal orifice, and rectum                            were photographed. Scope In: 10:55:48 AM Scope Out: 11:15:59 AM Scope Withdrawal Time: 0 hours 12 minutes 35 seconds  Total Procedure Duration: 0 hours 20 minutes 11 seconds  Findings:                 The perianal and digital rectal examinations were                            normal.                           Two sessile polyps were found in the rectum and                            sigmoid colon. The polyps were 1 to 2 mm in size.                            These polyps were removed with a cold biopsy                            forceps. Resection and retrieval were complete.  Scattered small and large-mouthed diverticula were                            found in the sigmoid colon and descending colon.                           Non-bleeding internal hemorrhoids were found during                            retroflexion. The hemorrhoids were medium-sized.                           The exam was otherwise without abnormality. Complications:            No immediate complications. Estimated Blood Loss:     Estimated blood loss was minimal. Impression:               - Two 1 to 2 mm polyps in the rectum and in the                            sigmoid colon, removed with a cold biopsy forceps.                            Resected and retrieved.                           - Diverticulosis in the sigmoid colon and in the                            descending colon.                           - Non-bleeding internal hemorrhoids.                           - The examination was otherwise  normal. Recommendation:           - Patient has a contact number available for                            emergencies. The signs and symptoms of potential                            delayed complications were discussed with the                            patient. Return to normal activities tomorrow.                            Written discharge instructions were provided to the                            patient.                           - Resume previous diet.                           -  Continue present medications.                           - Await pathology results.                           - Repeat colonoscopy in 5-10 years for surveillance                            based on pathology results. Mauri Pole, MD 10/13/2019 11:19:41 AM This report has been signed electronically.

## 2019-10-17 ENCOUNTER — Telehealth: Payer: Self-pay

## 2019-10-17 NOTE — Telephone Encounter (Signed)
  Follow up Call-  Call back number 10/13/2019  Post procedure Call Back phone  # [phone]  Permission to leave phone message Yes  Some recent data might be hidden     Patient questions:  Do you have a fever, pain , or abdominal swelling? No. Pain Score  0 *  Have you tolerated food without any problems? Yes.    Have you been able to return to your normal activities? Yes.    Do you have any questions about your discharge instructions: Diet   No. Medications  No. Follow up visit  No.  Do you have questions or concerns about your Care? No.  Actions: * If pain score is 4 or above: 1. No action needed, pain <4.Have you developed a fever since your procedure? no  2.   Have you had an respiratory symptoms (SOB or cough) since your procedure? no  3.   Have you tested positive for COVID 19 since your procedure no  4.   Have you had any family members/close contacts diagnosed with the COVID 19 since your procedure?  no   If yes to any of these questions please route to Joylene John, RN and Alphonsa Gin, Therapist, sports.

## 2019-10-19 ENCOUNTER — Encounter: Payer: Self-pay | Admitting: Gastroenterology
